# Patient Record
Sex: Male | Born: 1960 | Race: White | Hispanic: No
Health system: Southern US, Community
[De-identification: ages and names within clinical notes are randomized; demographics above are authoritative.]

---

## 1999-03-17 ENCOUNTER — Encounter: Payer: Self-pay | Admitting: Family Medicine

## 1999-03-17 ENCOUNTER — Ambulatory Visit (HOSPITAL_COMMUNITY): Admission: RE | Admit: 1999-03-17 | Discharge: 1999-03-17 | Payer: Self-pay | Admitting: Family Medicine

## 1999-03-18 ENCOUNTER — Ambulatory Visit (HOSPITAL_COMMUNITY): Admission: RE | Admit: 1999-03-18 | Discharge: 1999-03-18 | Payer: Self-pay | Admitting: Family Medicine

## 1999-03-18 ENCOUNTER — Encounter: Payer: Self-pay | Admitting: Family Medicine

## 2001-07-05 ENCOUNTER — Encounter: Admission: RE | Admit: 2001-07-05 | Discharge: 2001-07-05 | Payer: Self-pay | Admitting: Internal Medicine

## 2001-07-05 ENCOUNTER — Encounter: Payer: Self-pay | Admitting: Internal Medicine

## 2001-07-15 ENCOUNTER — Ambulatory Visit (HOSPITAL_COMMUNITY): Admission: RE | Admit: 2001-07-15 | Discharge: 2001-07-15 | Payer: Self-pay | Admitting: Internal Medicine

## 2001-07-15 ENCOUNTER — Encounter: Payer: Self-pay | Admitting: Internal Medicine

## 2001-07-22 ENCOUNTER — Ambulatory Visit (HOSPITAL_COMMUNITY): Admission: RE | Admit: 2001-07-22 | Discharge: 2001-07-22 | Payer: Self-pay | Admitting: General Surgery

## 2003-08-31 ENCOUNTER — Ambulatory Visit (HOSPITAL_COMMUNITY): Admission: RE | Admit: 2003-08-31 | Discharge: 2003-09-01 | Payer: Self-pay | Admitting: Neurosurgery

## 2008-04-01 ENCOUNTER — Emergency Department (HOSPITAL_COMMUNITY): Admission: EM | Admit: 2008-04-01 | Discharge: 2008-04-01 | Payer: Self-pay | Admitting: Emergency Medicine

## 2008-04-08 ENCOUNTER — Encounter (INDEPENDENT_AMBULATORY_CARE_PROVIDER_SITE_OTHER): Payer: Self-pay | Admitting: Urology

## 2008-04-08 ENCOUNTER — Ambulatory Visit: Admission: RE | Admit: 2008-04-08 | Discharge: 2008-04-08 | Payer: Self-pay | Admitting: Urology

## 2008-04-08 ENCOUNTER — Ambulatory Visit: Payer: Self-pay | Admitting: Vascular Surgery

## 2008-04-30 ENCOUNTER — Inpatient Hospital Stay (HOSPITAL_COMMUNITY): Admission: AD | Admit: 2008-04-30 | Discharge: 2008-05-02 | Payer: Self-pay | Admitting: General Surgery

## 2008-05-26 ENCOUNTER — Encounter: Admission: RE | Admit: 2008-05-26 | Discharge: 2008-05-26 | Payer: Self-pay | Admitting: General Surgery

## 2008-10-10 ENCOUNTER — Encounter: Admission: RE | Admit: 2008-10-10 | Discharge: 2008-10-10 | Payer: Self-pay | Admitting: General Surgery

## 2010-06-24 IMAGING — CT CT ABDOMEN W/ CM
2 of 5 series · 17 of 46 positions shown, 19 images · IV contrast (omnipaque)
Comparison: None

CT ABDOMEN

CLINICAL DATA: Abdominal pain, testicular discomfort, history of
bilateral inguinal hernia repairs

CT ABDOMEN AND PELVIS WITH CONTRAST
TECHNIQUE: Multidetector CT imaging of the abdomen and pelvis was
performed using the standard protocol following bolus
administration of intravenous contrast.
Contrast: 125 ml Omnipaque-YQQ

[Series 3: routine abdomen · axial · 0.80mm/px · z∈[-410,-80]mm · 14 of 67 slices shown, 16 images]
[im 4/67  soft-tissue]
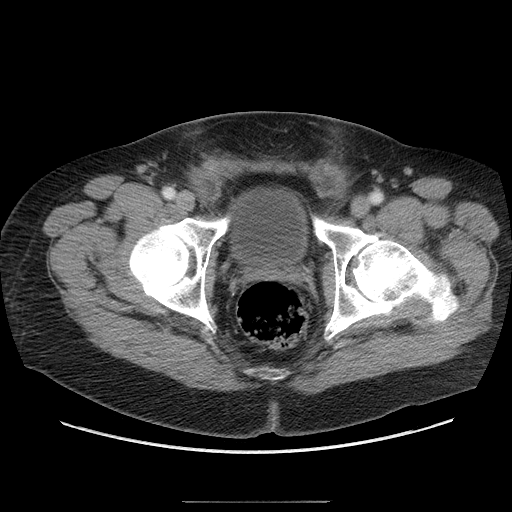
[im 4/67  bone]
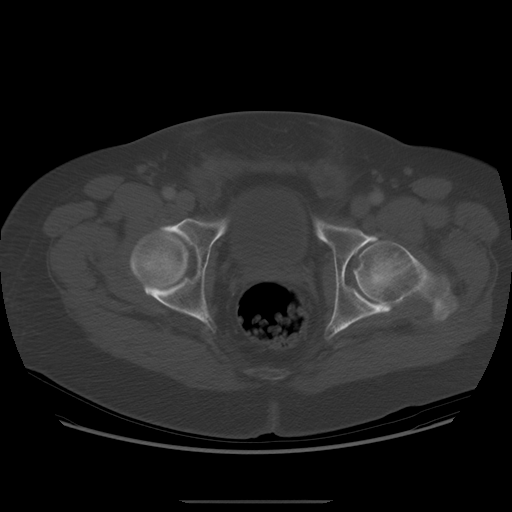
[im 8/67  soft-tissue]
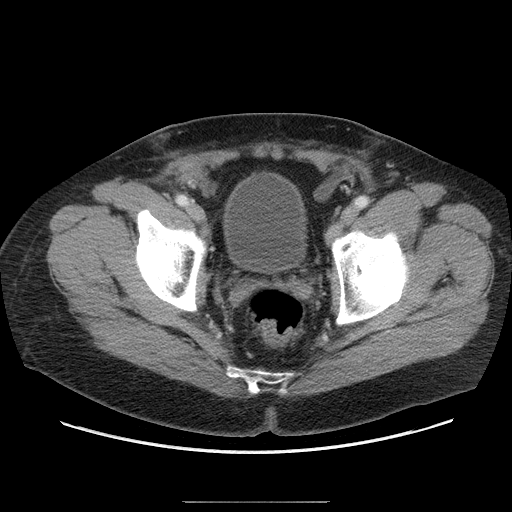
[im 12/67  soft-tissue]
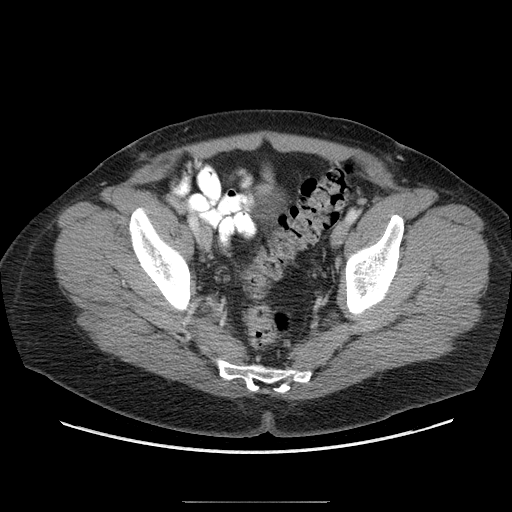
[im 20/67  soft-tissue]
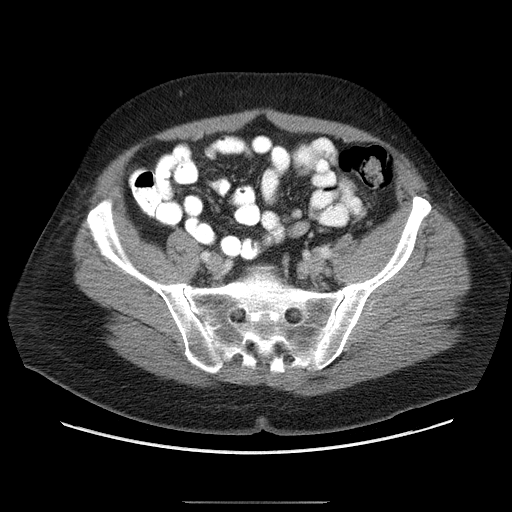
[im 24/67  soft-tissue]
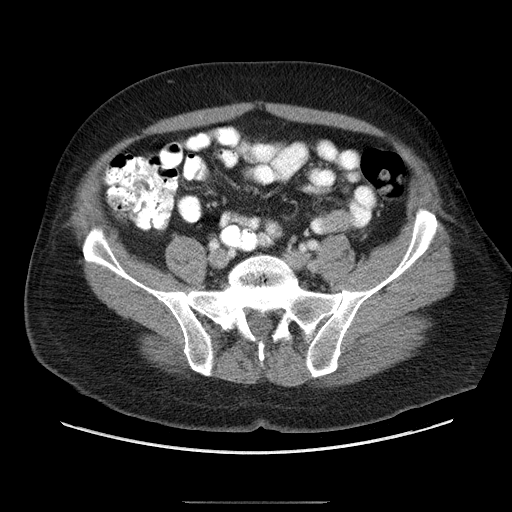
[im 28/67  soft-tissue]
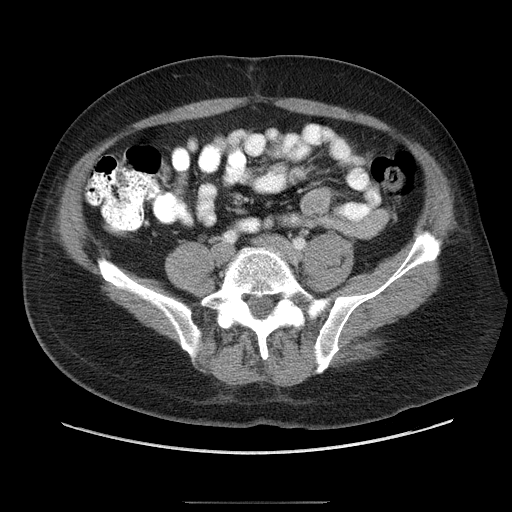
[im 32/67  soft-tissue]
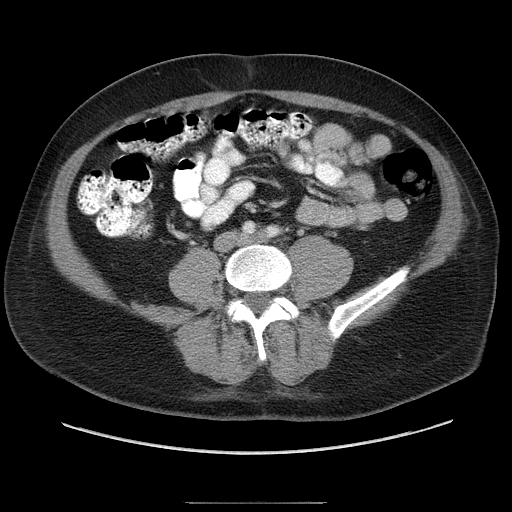
[im 35/67  soft-tissue]
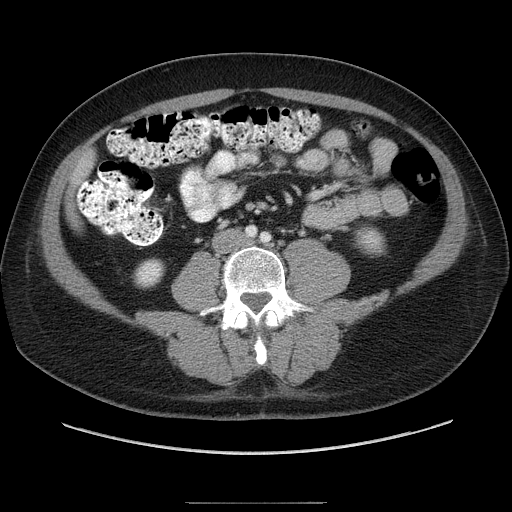
[im 39/67  soft-tissue]
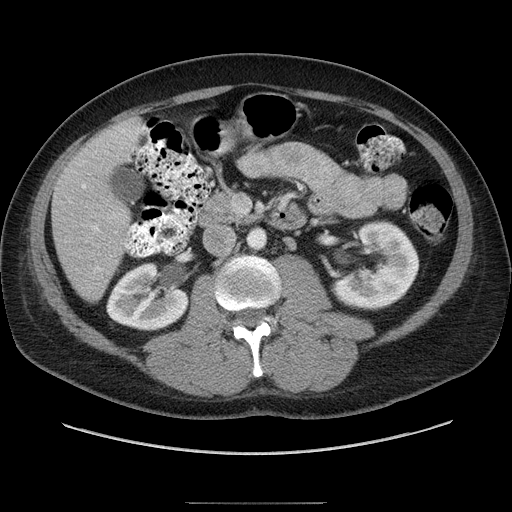
[im 39/67  bone]
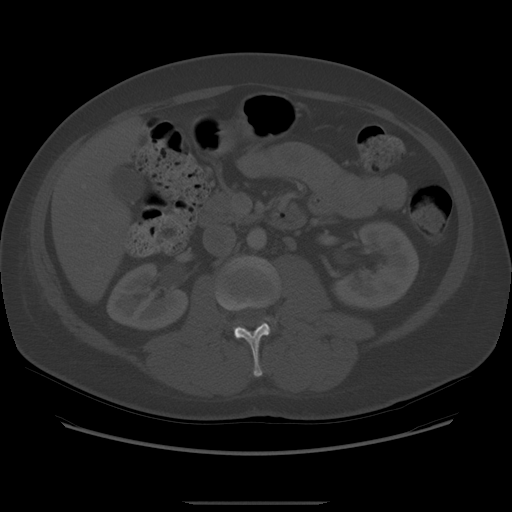
[im 43/67  soft-tissue]
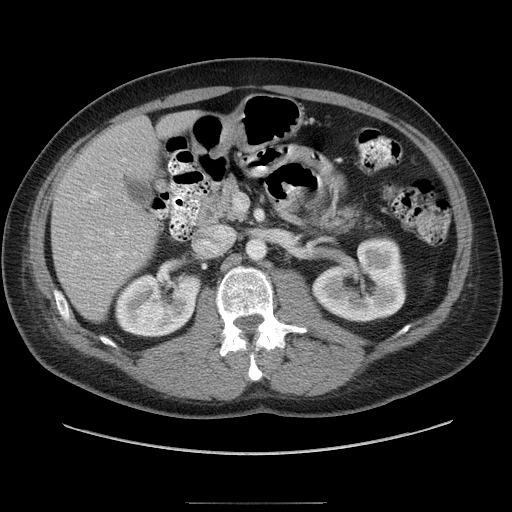
[im 51/67  soft-tissue]
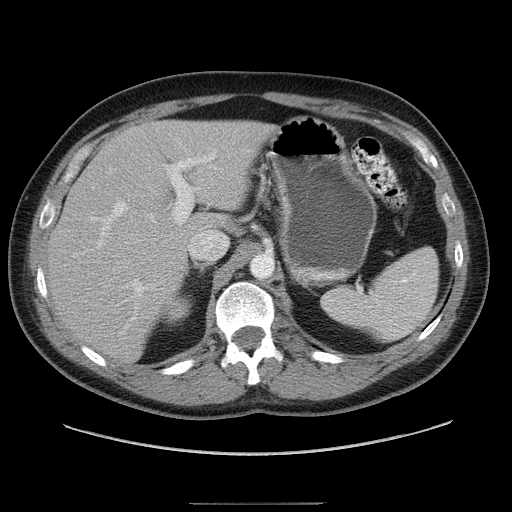
[im 55/67  soft-tissue]
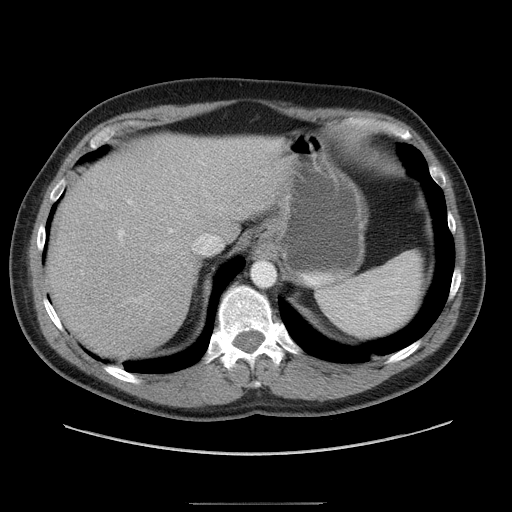
[im 59/67  soft-tissue]
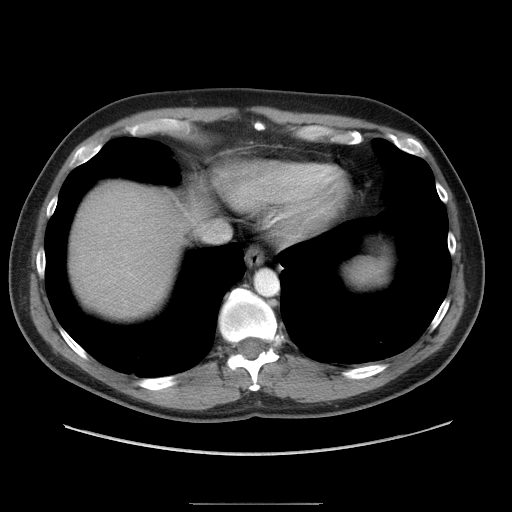
[im 63/67  soft-tissue]
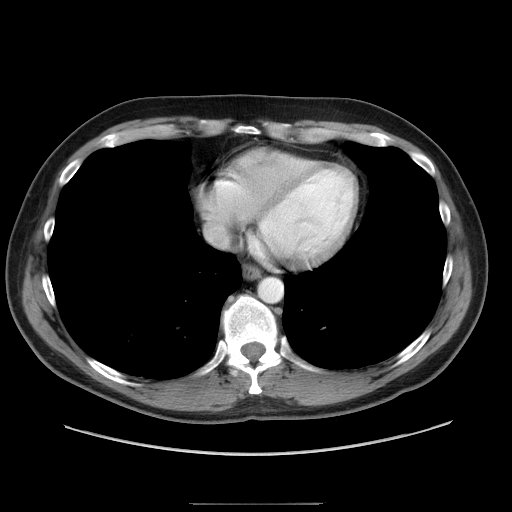

[Series 602: sagittal body · sagittal · 0.96mm/px · 3 of 165 slices shown]
[im 55/165  soft-tissue]
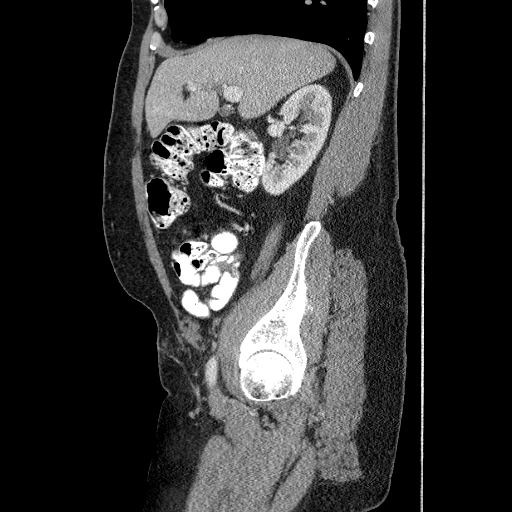
[im 73/165  soft-tissue]
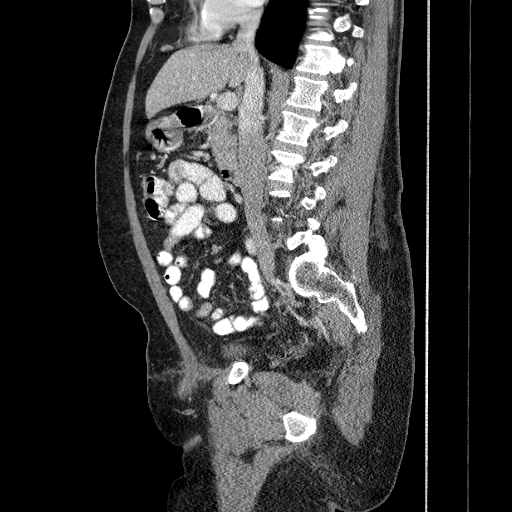
[im 92/165  soft-tissue]
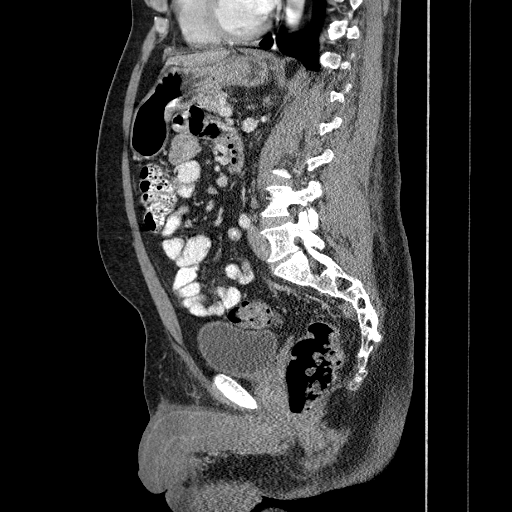

[17 of 46 positions shown; findings below may reference images not displayed]

FINDINGS: The lung bases are clear.  The liver enhances with no
focal abnormality and no ductal dilatation is seen.  No calcified
gallstones are noted.  The pancreas is normal in size and the
pancreatic duct is not dilated.  The adrenal glands and spleen
appear normal.  The kidneys enhance and on delayed images the
pelvocaliceal systems appear normal.  The abdominal aorta is normal
in caliber.  No adenopathy is seen.
IMPRESSION: No significant abnormality on CT of the abdomen.

CT PELVIS
FINDINGS: By history the patient has undergone bilateral open
inguinal hernia repairs 3 weeks ago.  There is strandiness of the
soft tissues superficially at the operative sites.  Within the
anterior abdomen at the level of the inguinal hernia repairs, there
are small oval fluid collections most consistent with postoperative
seromas.  Abscess cannot be excluded but seromas would be favored
with no air present within these collections. No recurrent hernia
is seen.  The urinary bladder is unremarkable.  The prostate is
normal in size.  No colonic abnormality is seen.
IMPRESSION: Small oval fluid collections at the site of open surgical repair
for bilateral inguinal hernias most consistent with postoperative
seromas.  Doubt abscesses.

## 2010-11-29 NOTE — Op Note (Signed)
NAMEULICE, FOLLETT              ACCOUNT NO.:  [id number]   MEDICAL RECORD NO.:  [id number]          PATIENT TYPE:  AMB   LOCATION:  DAY                          FACILITY:  [facility]   PHYSICIAN:  Almond Lint, MD       DATE OF BIRTH:  1961-05-22   DATE OF PROCEDURE:  04/29/2008  DATE OF DISCHARGE:                               OPERATIVE REPORT   PREOPERATIVE DIAGNOSIS:  Bilateral inguinal hernias.   POSTOPERATIVE DIAGNOSIS:  Bilateral inguinal hernias.   OPERATION PERFORMED:  Open bilateral inguinal hernia repair with mesh  plug and mesh patch.   SURGEON:  Almond Lint, MD   ASSISTANT:  Lennie Muckle, MD   ANESTHESIA:  General and local.   FINDINGS:  Large direct inguinal hernias bilaterally.  No indirect  components.   SPECIMENS:  None.   ESTIMATED BLOOD LOSS:  25 mL.   COMPLICATIONS:  None.   DESCRIPTION OF PROCEDURE:  Mr. Paredez was identified in the holding area  and taken to the operating room where he was placed supine on the  operating room table.  General endotracheal anesthesia was induced.  Foley catheter was placed.  The patient's abdomen was prepped and draped  in sterile fashion.  The time out was performed according the surgical  safety checklist.  The right side was the first side that was done as  this was the location that was most symptomatic.  The patient's anterior  superior iliac spine was identified as well as the pubis.  A 10 cm  incision at the midpoint between these two landmarks.  This is  approximately 3 cm above the inguinal crease.  The subcutaneous tissue  was divided with the Bovie electrocautery.  There was one crossing  superficial epigastric vein that was tied off with 3-0 Vicryl ties.  The  Scarpa's fascia was well indentified and opened with Bovie  electrocautery.  The external oblique fascia was cleaned off  superficially of the overlying fat.  A knife was used to open the  external oblique fascia and Metzenbaum scissors were passed  underneath  and lifted to make sure there was nothing caught underneath and the used  to incise the fascia to open the inguinal ring inferomedially and  superolaterally for several additional centimeters.  The spermatic cord  was identified and was passed around with the finger and then with the  Penrose drain.  The external oblique fascia was elevated using hemostat  clamps and blunt dissection was used with a Kitner was used to dissected  on the underside of the external oblique fascia.  On the underside, the  ilioinguinal nerve was identified and when the inferior edge of the  external oblique fascia was elevated, care was taken not to clamp the  ilioinguinal nerve.  This was dissected free of the underlying tissue  and protected.  The fascia was cleaned off inferiorly.  It was  immediately apparent that there was a very large direct component to  this hernia.  The cord was examined and there was no hernia sac seen.  There was a small cord lipoma which was  removed.  A large Bard mesh plug  was placed in the direct hernia region.  This was secured in place using  0 Prolene sutures.  It was secured to the overlying peritoneum of the  direct hernia as well as to the medial, superior and inferior edges of  the direct hernia.  A 3 x 6 portion of a 6 x 6 piece of mesh was trimmed  to be curvilinear on one end.  This was secured to just lateral to the  pubic tubercle.  The Prolene was run on the undersurface of the mesh to  the shelving edge.  The superior aspect was secured in similar fashion.  It was secured to the pubic tubercle and then tied down.  The upper edge  of the mesh was then run with the Prolene along the superior shelving  edge.  This was tied down just beyond the internal ring.  The tails were  cut in the mesh to the appropriate length and passed around the cord.  A  stay suture was placed to secure the tails together making sure there  was enough room to pass DeBakey forceps  between the cord and the mesh.  The tails were then tucked beneath the external oblique fascia.  The  external oblique fascia was then closed using 3-0 Vicryl in a running  fashion.  The Scarpa's fascia was run using a 3-0 Vicryl running suture.  The skin was reapproximated using deep dermal sutures of 3-0 Vicryl.  Attention was then directed to the other side of the hernia and  dissection was carried identically and the mesh plug was placed  similarly.  On this side however, on the left side, the ilioinguinal  nerve was on the superior aspect and was again protected from the  hemostats and protected from the suture.  Otherwise, the procedure was  done exactly the same as the other side.  A plug was used and a  3 x 6  portion of Prolene mesh.  At the end of the case, both of the incisions  were run using 4-0 Monocryl.  The wounds were then cleaned, dried with  Dermabond.      Almond Lint, MD  Electronically Signed     FB/MEDQ  D:  04/29/2008  T:  04/29/2008  Job:  [id number]

## 2010-12-02 NOTE — Op Note (Signed)
NAME:  Vincent Reid, Vincent Reid                        ACCOUNT NO.:  [id number]   MEDICAL RECORD NO.:  [id number]                   PATIENT TYPE:  OIB   LOCATION:  NA                                   FACILITY:  [facility]   PHYSICIAN:  Reinaldo Meeker, M.D.              DATE OF BIRTH:  01-04-1961   DATE OF PROCEDURE:  08/31/2003  DATE OF DISCHARGE:                                 OPERATIVE REPORT   PREOPERATIVE DIAGNOSIS:  Herniated disk L5-S1 right.   POSTOPERATIVE DIAGNOSIS:  Herniated disk L5-S1 right.   PROCEDURE:  1. Right L5-S1 intralaminar laminotomy with resection of  herniated disk     with operating microscope.  2. Secondary procedure microdissection of L5-S1 disk  and S1 nerve root.   SURGEON:  Reinaldo Meeker, M.D.   ASSISTANT:  Tia Alert, MD   DESCRIPTION OF PROCEDURE:  Having been placed in the prone position, the  patient's back was prepped and draped in the usual sterile fashion. A  localizing x-ray was taken prior to the incision to identify the appropriate  level.   A midline  incision was made about the spinous process of L5 and S1. Using  the Bovie cutting current, the incision was carried down to the spinous  process. Subperiosteal dissection was then carried out from along the right  side of the spinous process and lamina and the McCullough self-retaining  retractor was placed for exposure. A 2nd x-ray was taken which showed  approach to the appropriate level.   Using the high-speed drill the inferior 1/3rd  of the L5 lamina and the  medial 1/3rd of the facet joint were removed. The drill was then used to  remove the superior 1/3rd of the S1 lamina. Residual bone and ligament of  Flavum were removed in a piecemeal fashion. The microscope was draped and  brought into the field and used until the end of the case.   Using microdissection technique, the lateral aspect of the  thecal sac and  S1 nerve root were identified. Further  coagulation was carried down  to the  floor of the canal to identify the L5-S1 disk. Prior to encountering the  annulus, free fragment disk  material was identified. This was removed in a  piecemeal fashion.   The annulus was then coagulated and incised with a #15 blade. Using  pituitary rongeurs and curets, a very thorough disk space cleanout was  carried out while at the same care was taken to avoid injury to the  __________ and this successfully done. At this point inspection was carried  out in all directions for any evidence of residual compression. None could  be identified. Large amounts of irrigation were carried out and the bleeding  was controlled with bipolar coagulation and Gelfoam.   The wound was then closed using interrupted Vicryl in the muscle fascia and  subcutaneous and subcuticular tissues with staples on the  skin. A sterile  dressing was the applied. The patient was extubated and taken to recovery in  stable condition.                                               Reinaldo Meeker, M.D.    ROK/MEDQ  D:  08/31/2003  T:  08/31/2003  Job:  [id number]

## 2010-12-02 NOTE — Op Note (Signed)
[facility]  Patient:    Vincent Reid, Vincent Reid Visit Number: [id number] MRN: [id number]          Service Type: DSU Location: DAY Attending Physician:  Brandy Hale Proc. Date: 07/22/01 Admit Date:  07/22/2001   CC:         Claretta Fraise, M.D.   Operative Report  PREOPERATIVE DIAGNOSIS:  Incarcerated umbilical hernia.  POSTOPERATIVE DIAGNOSIS:  Incarcerated umbilical hernia.  PROCEDURE PERFORMED:  Repair incarcerated umbilical hernia.  SURGEON:  Angelia Mould. Derrell Lolling, M.D.  INDICATION:  This is a 50 year old white male who has had an umbilical hernia for two years that has not been reducible, but has become painful over the past three weeks.  This started after he was doing some heavy yard work during [employer] and has been painful everyday since that time.  No abdominal pain, nausea, vomiting, or distention.  No change in bowel habits of significance.  On exam, he has an incarcerated umbilical hernia with the hernia sac about 2 cm in diameter, not reducible, somewhat tender, but not inflamed and not infected.  He is brought to the operating room for repair.  DESCRIPTION OF PROCEDURE:  Following the induction of general endotracheal anesthesia, the patients abdomen was prepped and draped in a sterile fashion. Xylocaine 1% with epinephrine was used as a local infiltration anesthetic.  A transverse, curved incision was made at the lower rim of the umbilicus. Dissection was carried down to the subcutaneous tissue, all the way down to the anterior abdominal wall fascia.  The umbilical skin was dissected off of the hernia sac and the sac was opened.  This was found to contain about a 2.0 cm mass of preperitoneal fat.  This was debrided.  Once this was dissected down, the base of the periumbilical fat was clamped and the fat amputated. This was ligated with a 2-0 Vicryl tie.  The defect in the fascia was about 1 cm to 1.5 cm in diameter.   The subcutaneous tissue was undermined a little bit to facilitate closure.  The umbilical defect was closed transversely with four interrupted sutures of 0 Novofil.  These sutures were all placed under direct vision and then after they were all placed, they were individually tied.  This provided a very secure repair.  The wound was irrigated with saline.  Hemostasis was excellent.  The umbilical skin was packed right down to the fascia with 3-0 Vicryl suture.  The subcutaneous tissue was closed with interrupted sutures of 3-0 Vicryl.  The skin was closed with a running subcuticular suture of 4-0 Vicryl and Steri-Strips.  Clean bandages were placed and the patient was taken to the recovery room in stable condition.  Estimated blood loss was about 10 cc.  Complications none.  Sponge, needle, and instrument counts were correct. ttending Physician:  Brandy Hale DD:  07/22/01 TD:  07/22/01 Job: [id number]

## 2011-04-17 LAB — URINALYSIS, ROUTINE W REFLEX MICROSCOPIC
Bilirubin Urine: NEGATIVE
Hgb urine dipstick: NEGATIVE

## 2011-04-17 LAB — DIFFERENTIAL
Basophils Absolute: 0
Eosinophils Absolute: 0.2
Eosinophils Relative: 3
Lymphs Abs: 2.1
Monocytes Absolute: 0.4
Neutrophils Relative %: 51

## 2011-04-17 LAB — COMPREHENSIVE METABOLIC PANEL
AST: 23
Albumin: 3.8
BUN: 9
Calcium: 9.3
Creatinine, Ser: 0.98
GFR calc non Af Amer: >60
Glucose, Bld: 101 — ABNORMAL HIGH
Potassium: 3.9
Sodium: 139
Total Bilirubin: 1.2
Total Protein: 6.3

## 2011-04-17 LAB — CBC
MCV: 97.9
Platelets: 214
RBC: 4.58
RDW: 12.9

## 2011-04-17 LAB — URINE CULTURE: Culture: NO GROWTH

## 2012-11-14 DEATH — deceased

## 2016-04-07 ENCOUNTER — Telehealth: Payer: Self-pay | Admitting: *Deleted

## 2016-04-07 NOTE — Telephone Encounter (Signed)
Entered In error
